# Patient Record
Sex: Female | Born: 1995 | Race: White | Hispanic: No | Marital: Single | Smoking: Current some day smoker
Health system: Southern US, Community
[De-identification: ages and names within clinical notes are randomized; demographics above are authoritative.]

## PROBLEM LIST (undated history)

## (undated) HISTORY — PX: WISDOM TOOTH EXTRACTION: SHX21

---
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
